# Patient Record
Sex: Female | Born: 1990
Health system: Southern US, Community
[De-identification: ages and names within clinical notes are randomized; demographics above are authoritative.]

## PROBLEM LIST (undated history)

## (undated) HISTORY — PX: WISDOM TOOTH EXTRACTION: SHX21

---

## 2017-02-07 ENCOUNTER — Emergency Department (HOSPITAL_COMMUNITY)
Admission: EM | Admit: 2017-02-07 | Discharge: 2017-02-07 | Disposition: A | Discharge status: Home / Self Care | Payer: PRIVATE HEALTH INSURANCE | Attending: Emergency Medicine | Admitting: Emergency Medicine

## 2017-02-07 ENCOUNTER — Emergency Department (HOSPITAL_COMMUNITY): Payer: PRIVATE HEALTH INSURANCE

## 2017-02-07 ENCOUNTER — Encounter (HOSPITAL_COMMUNITY): Payer: Self-pay | Admitting: Emergency Medicine

## 2017-02-07 DIAGNOSIS — Y939 Activity, unspecified: Secondary | ICD-10-CM | POA: Diagnosis not present

## 2017-02-07 DIAGNOSIS — Y999 Unspecified external cause status: Secondary | ICD-10-CM | POA: Insufficient documentation

## 2017-02-07 DIAGNOSIS — Y929 Unspecified place or not applicable: Secondary | ICD-10-CM | POA: Insufficient documentation

## 2017-02-07 DIAGNOSIS — S0083XA Contusion of other part of head, initial encounter: Secondary | ICD-10-CM | POA: Insufficient documentation

## 2017-02-07 DIAGNOSIS — S0990XA Unspecified injury of head, initial encounter: Secondary | ICD-10-CM | POA: Diagnosis present

## 2017-02-07 LAB — POC URINE PREG, ED: Preg Test, Ur: NEGATIVE

## 2017-02-07 NOTE — ED Notes (Signed)
Pt A&Ox4, ambulatory at d/c with independent steady gait, NAD. Pt verbalized understanding of d/c instructions and follow up care. 

## 2017-02-07 NOTE — ED Notes (Signed)
Nurse will talk to provider about if urine still needed.
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
## 2017-02-07 NOTE — Discharge Instructions (Signed)
Apply ice several times a day. Take acetaminophen, ibuprofen, or naproxen as needed for pain. °
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
## 2017-08-23 ENCOUNTER — Other Ambulatory Visit: Payer: Self-pay

## 2017-08-23 ENCOUNTER — Encounter (HOSPITAL_COMMUNITY): Payer: Self-pay | Admitting: Emergency Medicine

## 2017-08-23 ENCOUNTER — Emergency Department (HOSPITAL_COMMUNITY)
Admission: EM | Admit: 2017-08-23 | Discharge: 2017-08-23 | Disposition: A | Discharge status: Home / Self Care | Payer: No Typology Code available for payment source | Attending: Emergency Medicine | Admitting: Emergency Medicine

## 2017-08-23 DIAGNOSIS — M542 Cervicalgia: Secondary | ICD-10-CM | POA: Insufficient documentation

## 2017-08-23 DIAGNOSIS — M7918 Myalgia, other site: Secondary | ICD-10-CM

## 2017-08-23 DIAGNOSIS — Z041 Encounter for examination and observation following transport accident: Secondary | ICD-10-CM | POA: Diagnosis present

## 2017-08-23 MED ORDER — METHOCARBAMOL 500 MG PO TABS: 500.0000 mg | ORAL_TABLET | Freq: Every evening | ORAL | 0 refills | Status: DC | PRN
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
## 2018-07-31 ENCOUNTER — Ambulatory Visit (INDEPENDENT_AMBULATORY_CARE_PROVIDER_SITE_OTHER): Payer: 59 | Admitting: Family Medicine

## 2018-07-31 ENCOUNTER — Encounter: Payer: Self-pay | Admitting: Family Medicine

## 2018-07-31 VITALS — BP 98/70 | HR 58 | Temp 97.5°F | Ht 63.0 in | Wt 160.0 lb

## 2018-07-31 DIAGNOSIS — Z13220 Encounter for screening for lipoid disorders: Secondary | ICD-10-CM | POA: Diagnosis not present

## 2018-07-31 DIAGNOSIS — R5383 Other fatigue: Secondary | ICD-10-CM

## 2018-07-31 DIAGNOSIS — Z131 Encounter for screening for diabetes mellitus: Secondary | ICD-10-CM

## 2018-07-31 DIAGNOSIS — Z0001 Encounter for general adult medical examination with abnormal findings: Secondary | ICD-10-CM

## 2018-07-31 DIAGNOSIS — Z0000 Encounter for general adult medical examination without abnormal findings: Secondary | ICD-10-CM

## 2018-07-31 LAB — BASIC METABOLIC PANEL
BUN: 11 mg/dL (ref 6–23)
CALCIUM: 9.1 mg/dL (ref 8.4–10.5)
CO2: 27 meq/L (ref 19–32)
CREATININE: 0.9 mg/dL (ref 0.40–1.20)
Chloride: 103 mEq/L (ref 96–112)
GFR: 92.1 mL/min (ref >60.00)
Glucose, Bld: 83 mg/dL (ref 70–99)
Potassium: 4.2 mEq/L (ref 3.5–5.1)
Sodium: 139 mEq/L (ref 135–145)

## 2018-07-31 LAB — CBC WITH DIFFERENTIAL/PLATELET
BASOS ABS: 0 10*3/uL (ref 0.0–0.1)
BASOS PCT: 0.4 % (ref 0.0–3.0)
Eosinophils Absolute: 0.1 10*3/uL (ref 0.0–0.7)
Eosinophils Relative: 1.8 % (ref 0.0–5.0)
HEMATOCRIT: 38.7 % (ref 36.0–46.0)
Hemoglobin: 12.3 g/dL (ref 12.0–15.0)
LYMPHS PCT: 39.7 % (ref 12.0–46.0)
Lymphs Abs: 2.5 10*3/uL (ref 0.7–4.0)
MCHC: 31.8 g/dL (ref 30.0–36.0)
MCV: 74.2 fl — ABNORMAL LOW (ref 78.0–100.0)
MONO ABS: 0.5 10*3/uL (ref 0.1–1.0)
Monocytes Relative: 7.7 % (ref 3.0–12.0)
NEUTROS ABS: 3.2 10*3/uL (ref 1.4–7.7)
NEUTROS PCT: 50.4 % (ref 43.0–77.0)
PLATELETS: 285 10*3/uL (ref 150.0–400.0)
RBC: 5.21 Mil/uL — ABNORMAL HIGH (ref 3.87–5.11)
RDW: 13.9 % (ref 11.5–15.5)
WBC: 6.3 10*3/uL (ref 4.0–10.5)

## 2018-07-31 LAB — LIPID PANEL
CHOL/HDL RATIO: 3
Cholesterol: 228 mg/dL — ABNORMAL HIGH (ref 0–200)
HDL: 81.9 mg/dL (ref >39.00)
LDL CALC: 134 mg/dL — AB (ref 0–99)
NONHDL: 146.2
Triglycerides: 61 mg/dL (ref 0.0–149.0)
VLDL: 12.2 mg/dL (ref 0.0–40.0)

## 2018-07-31 LAB — TSH: TSH: 2.68 u[IU]/mL (ref 0.35–4.50)

## 2018-07-31 LAB — T4, FREE: Free T4: 0.81 ng/dL (ref 0.60–1.60)

## 2018-07-31 LAB — HEMOGLOBIN A1C: Hgb A1c MFr Bld: 5.6 % (ref 4.6–6.5)

## 2018-07-31 LAB — VITAMIN D 25 HYDROXY (VIT D DEFICIENCY, FRACTURES): VITD: 21.17 ng/mL — ABNORMAL LOW (ref 30.00–100.00)

## 2018-07-31 NOTE — Progress Notes (Signed)
Subjective:     Brenda HeysRebecca Levitt is a 28 y.o. female and is here for a comprehensive physical exam. The patient reports problems - fatigue.  Pt would like labs, is fasting.  Pt notes waking up feeling tired some nights.  Pt notes feeling more if she goes to bed at midnight.  Typically wakes up at 730 to 8 AM.  Patient trying to drink more water, may have 3 containers at work and a few glasses at home.  Patient denies prior history of anemia.  Last Pap: 2019 LMP 08/02/2018.  On the patch x2 years. Allergies: NKDA  Social history: Patient is single.  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  Pt endorses social alcohol use.  Pt denies tobacco and drug use.  Family medical history: Mom-depression, mental illness Dad-diabetes Sister-Marcelle Sister-[place] Brother-Orphe Social History   Socioeconomic History  . Marital status: Single    Spouse name: Not on file  . Number of children: Not on file  . Years of education: Not on file  . Highest education level: Not on file  Occupational History  . Not on file  Social Needs  . Financial resource strain: Not on file  . Food insecurity:    Worry: Not on file    Inability: Not on file  . Transportation needs:    Medical: Not on file    Non-medical: Not on file  Tobacco Use  . Smoking status: Never Smoker  . Smokeless tobacco: Never Used  Substance and Sexual Activity  . Alcohol use: Yes    Comment: occ  . Drug use: No  . Sexual activity: Yes    Birth control/protection: Patch  Lifestyle  . Physical activity:    Days per week: Not on file    Minutes per session: Not on file  . Stress: Not on file  Relationships  . Social connections:    Talks on phone: Not on file    Gets together: Not on file    Attends religious service: Not on file    Active member of club or organization: Not on file    Attends meetings of clubs or organizations: Not on file    Relationship status: Not on file  . Intimate  partner violence:    Fear of current or ex partner: Not on file    Emotionally abused: Not on file    Physically abused: Not on file    Forced sexual activity: Not on file  Other Topics Concern  . Not on file  Social History Narrative  . Not on file   Health Maintenance  Topic Date Due  . HIV Screening  04/18/2008  . TETANUS/TDAP  04/18/2012  . PAP-Cervical Cytology Screening  04/18/2014  . PAP SMEAR-Modifier  04/18/2014  . INFLUENZA VACCINE  02/09/2018    The following portions of the patient's history were reviewed and updated as appropriate: allergies, current medications, past family history, past medical history, past social history, past surgical history and problem list.  Review of Systems Pertinent items noted in HPI and remainder of comprehensive ROS otherwise negative.   Objective:    BP 98/70 (BP Location: Left Arm, Patient Position: Sitting, Cuff Size: Normal)   Pulse (!) 58   Temp (!) 97.5 F (36.4 C) (Oral)   Ht 5\' 3"  (1.6 m)   Wt 160 lb (72.6 kg)   LMP 07/26/2018 (Exact Date)   SpO2 99%   BMI 28.34 kg/m  General appearance:  alert, cooperative and no distress Head: Normocephalic, without obvious abnormality, atraumatic Eyes: conjunctivae/corneas clear. PERRL, EOM's intact. Fundi benign. Ears: normal TM's and external ear canals both ears Nose: Nares normal. Septum midline. Mucosa normal. No drainage or sinus tenderness. Throat: lips, mucosa, and tongue normal; teeth and gums normal Neck: no adenopathy, no carotid bruit, no JVD, supple, symmetrical, trachea midline and thyroid not enlarged, symmetric, no tenderness/mass/nodules Lungs: clear to auscultation bilaterally Heart: regular rate and rhythm, S1, S2 normal, no murmur, click, rub or gallop Abdomen: soft, non-tender; bowel sounds normal; no masses,  no organomegaly Extremities: extremities normal, atraumatic, no cyanosis or edema Skin: Skin color, texture, turgor normal. No rashes or  lesions Neurologic: Alert and oriented X 3, normal strength and tone. Normal symmetric reflexes. Normal coordination and gait    Assessment:    Healthy female exam.     Plan:     Anticipatory guidance given including wearing seatbelts, smoke detectors in the home, increasing physical activity, increasing p.o. intake of water and vegetables. -will obtain labs -given handout -Next CPE in 1 year See After Visit Summary for Counseling Recommendations    Fatigue -We will obtain labs including vitamin D, TSH, free T4, CBC -Discussed ways to decrease fatigue including getting plenty of rest each night, exercising -Given handout.  Abbe Amsterdam, MD

## 2018-07-31 NOTE — Patient Instructions (Signed)
Preventive Care 18-39 Years, Female Preventive care refers to lifestyle choices and visits with your health care provider that can promote health and wellness. What does preventive care include?   A yearly physical exam. This is also called an annual well check.  Dental exams once or twice a year.  Routine eye exams. Ask your health care provider how often you should have your eyes checked.  Personal lifestyle choices, including: ? Daily care of your teeth and gums. ? Regular physical activity. ? Eating a healthy diet. ? Avoiding tobacco and drug use. ? Limiting alcohol use. ? Practicing safe sex. ? Taking vitamin and mineral supplements as recommended by your health care provider. What happens during an annual well check? The services and screenings done by your health care provider during your annual well check will depend on your age, overall health, lifestyle risk factors, and family history of disease. Counseling Your health care provider may ask you questions about your:  Alcohol use.  Tobacco use.  Drug use.  Emotional well-being.  Home and relationship well-being.  Sexual activity.  Eating habits.  Work and work environment.  Method of birth control.  Menstrual cycle.  Pregnancy history. Screening You may have the following tests or measurements:  Height, weight, and BMI.  Diabetes screening. This is done by checking your blood sugar (glucose) after you have not eaten for a while (fasting).  Blood pressure.  Lipid and cholesterol levels. These may be checked every 5 years starting at age 20.  Skin check.  Hepatitis C blood test.  Hepatitis B blood test.  Sexually transmitted disease (STD) testing.  BRCA-related cancer screening. This may be done if you have a family history of breast, ovarian, tubal, or peritoneal cancers.  Pelvic exam and Pap test. This may be done every 3 years starting at age 21. Starting at age 30, this may be done every 5  years if you have a Pap test in combination with an HPV test. Discuss your test results, treatment options, and if necessary, the need for more tests with your health care provider. Vaccines Your health care provider may recommend certain vaccines, such as:  Influenza vaccine. This is recommended every year.  Tetanus, diphtheria, and acellular pertussis (Tdap, Td) vaccine. You may need a Td booster every 10 years.  Varicella vaccine. You may need this if you have not been vaccinated.  HPV vaccine. If you are 26 or younger, you may need three doses over 6 months.  Measles, mumps, and rubella (MMR) vaccine. You may need at least one dose of MMR. You may also need a second dose.  Pneumococcal 13-valent conjugate (PCV13) vaccine. You may need this if you have certain conditions and were not previously vaccinated.  Pneumococcal polysaccharide (PPSV23) vaccine. You may need one or two doses if you smoke cigarettes or if you have certain conditions.  Meningococcal vaccine. One dose is recommended if you are age 19-21 years and a first-year college student living in a residence hall, or if you have one of several medical conditions. You may also need additional booster doses.  Hepatitis A vaccine. You may need this if you have certain conditions or if you travel or work in places where you may be exposed to hepatitis A.  Hepatitis B vaccine. You may need this if you have certain conditions or if you travel or work in places where you may be exposed to hepatitis B.  Haemophilus influenzae type b (Hib) vaccine. You may need this if you   have certain risk factors. Talk to your health care provider about which screenings and vaccines you need and how often you need them. This information is not intended to replace advice given to you by your health care provider. Make sure you discuss any questions you have with your health care provider. Document Released: 08/24/2001 Document Revised: 02/08/2017  Document Reviewed: 04/29/2015 Elsevier Interactive Patient Education  2019 [employer].  Fatigue If you have fatigue, you feel tired all the time and have a lack of energy or a lack of motivation. Fatigue may make it difficult to start or complete tasks because of exhaustion. In general, occasional or mild fatigue is often a normal response to activity or life. However, long-lasting (chronic) or extreme fatigue may be a symptom of a medical condition. Follow these instructions at home: General instructions  Watch your fatigue for any changes.  Go to bed and get up at the same time every day.  Avoid fatigue by pacing yourself during the day and getting enough sleep at night.  Maintain a healthy weight. Medicines  Take over-the-counter and prescription medicines only as told by your health care provider.  Take a multivitamin, if told by your health care provider.  Do not use herbal or dietary supplements unless they are approved by your health care provider. Activity   Exercise regularly, as told by your health care provider.  Use or practice techniques to help you relax, such as yoga, tai chi, meditation, or massage therapy. Eating and drinking   Avoid heavy meals in the evening.  Eat a well-balanced diet, which includes lean proteins, whole grains, plenty of fruits and vegetables, and low-fat dairy products.  Avoid consuming too much caffeine.  Avoid the use of alcohol.  Drink enough fluid to keep your urine pale yellow. Lifestyle  Change situations that cause you stress. Try to keep your work and personal schedule in balance.  Do not use any products that contain nicotine or tobacco, such as cigarettes and e-cigarettes. If you need help quitting, ask your health care provider.  Do not use drugs. Contact a health care provider if:  Your fatigue does not get better.  You have a fever.  You suddenly lose or gain weight.  You have headaches.  You have trouble  falling asleep or sleeping through the night.  You feel angry, guilty, anxious, or sad.  You are unable to have a bowel movement (constipation).  Your skin is dry.  You have swelling in your legs or another part of your body. Get help right away if:  You feel confused.  Your vision is blurry.  You feel faint or you pass out.  You have a severe headache.  You have severe pain in your abdomen, your back, or the area between your waist and hips (pelvis).  You have chest pain, shortness of breath, or an irregular or fast heartbeat.  You are unable to urinate, or you urinate less than normal.  You have abnormal bleeding, such as bleeding from the rectum, vagina, nose, lungs, or nipples.  You vomit blood.  You have thoughts about hurting yourself or others. If you ever feel like you may hurt yourself or others, or have thoughts about taking your own life, get help right away. You can go to your nearest emergency department or call:  Your local emergency services (911 in the [place]  A suicide crisis helpline, such as the [facility] at 601-715-9992. This is open 24 hours a day. Summary  If you have fatigue, you feel tired all the time and have a lack of energy or a lack of motivation.  Fatigue may make it difficult to start or complete tasks because of exhaustion.  Long-lasting (chronic) or extreme fatigue may be a symptom of a medical condition.  Exercise regularly, as told by your health care provider.  Change situations that cause you stress. Try to keep your work and personal schedule in balance. This information is not intended to replace advice given to you by your health care provider. Make sure you discuss any questions you have with your health care provider. Document Released: 04/25/2007 Document Revised: 03/23/2017 Document Reviewed: 03/23/2017 Elsevier Interactive Patient Education  [employer].

## 2018-08-01 ENCOUNTER — Other Ambulatory Visit: Payer: Self-pay | Admitting: Family Medicine

## 2018-08-01 DIAGNOSIS — E559 Vitamin D deficiency, unspecified: Secondary | ICD-10-CM

## 2018-08-01 DIAGNOSIS — L910 Hypertrophic scar: Secondary | ICD-10-CM | POA: Diagnosis not present

## 2018-08-01 MED ORDER — VITAMIN D (ERGOCALCIFEROL) 1.25 MG (50000 UNIT) PO CAPS
50000.0000 [IU] | ORAL_CAPSULE | ORAL | 0 refills | Status: AC
Start: 2018-08-01 — End: ?

## 2018-08-04 ENCOUNTER — Telehealth: Payer: Self-pay

## 2018-08-04 NOTE — Telephone Encounter (Signed)
Spoke with pt aware of her lab results
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
## 2018-10-22 ENCOUNTER — Other Ambulatory Visit: Payer: Self-pay | Admitting: Family Medicine

## 2018-10-22 DIAGNOSIS — E559 Vitamin D deficiency, unspecified: Secondary | ICD-10-CM

## 2018-10-25 ENCOUNTER — Other Ambulatory Visit: Payer: Self-pay | Admitting: Family Medicine

## 2018-10-25 DIAGNOSIS — E559 Vitamin D deficiency, unspecified: Secondary | ICD-10-CM

## 2018-10-26 ENCOUNTER — Other Ambulatory Visit: Payer: Self-pay | Admitting: Family Medicine

## 2018-10-26 ENCOUNTER — Other Ambulatory Visit: Payer: Self-pay

## 2018-10-26 ENCOUNTER — Encounter: Payer: Self-pay | Admitting: Family Medicine

## 2018-10-26 MED ORDER — NORELGESTROMIN-ETH ESTRADIOL 150-35 MCG/24HR TD PTWK: 1.0000 | MEDICATED_PATCH | TRANSDERMAL | 2 refills | Status: AC

## 2018-10-26 NOTE — Telephone Encounter (Signed)
Rx has already been sent to pt pharmacy °

## 2018-10-26 NOTE — Telephone Encounter (Signed)
This Rx was a one time dose, pt is aware to get OTC vit D.

## 2018-10-26 NOTE — Telephone Encounter (Signed)
Requested medication (s) are due for refill today: ?  Requested medication (s) are on the active medication list: yes  Last refill:  ?  Future visit scheduled: no  Notes to clinic: historical provider   Requested Prescriptions  Pending Prescriptions Disp Refills   norelgestromin-ethinyl estradiol (ORTHO EVRA) 150-35 MCG/24HR transdermal patch 3 patch     Sig: Place 1 patch onto the skin once a week.     OB/GYN:  Contraceptives Passed - 10/26/2018  9:24 AM      Passed - Last BP in normal range    BP Readings from Last 1 Encounters:  07/31/18 98/70         Passed - Valid encounter within last 12 months    Recent Outpatient Visits          2 months ago Well adult exam   Nature conservation officer at [employer], Bettey Mare, MD

## 2019-01-24 ENCOUNTER — Other Ambulatory Visit: Payer: Self-pay | Admitting: *Deleted

## 2019-01-24 DIAGNOSIS — Z20822 Contact with and (suspected) exposure to covid-19: Secondary | ICD-10-CM

## 2019-01-28 LAB — NOVEL CORONAVIRUS, NAA: SARS-CoV-2, NAA: NOT DETECTED

## 2020-12-30 ENCOUNTER — Other Ambulatory Visit: Payer: Self-pay

## 2020-12-31 ENCOUNTER — Ambulatory Visit (INDEPENDENT_AMBULATORY_CARE_PROVIDER_SITE_OTHER): Payer: 59 | Admitting: Family Medicine

## 2020-12-31 ENCOUNTER — Encounter: Payer: Self-pay | Admitting: Family Medicine

## 2020-12-31 VITALS — BP 112/72 | HR 66 | Temp 98.2°F | Wt 156.6 lb

## 2020-12-31 DIAGNOSIS — M25561 Pain in right knee: Secondary | ICD-10-CM

## 2020-12-31 DIAGNOSIS — M25572 Pain in left ankle and joints of left foot: Secondary | ICD-10-CM | POA: Diagnosis not present

## 2020-12-31 DIAGNOSIS — W109XXD Fall (on) (from) unspecified stairs and steps, subsequent encounter: Secondary | ICD-10-CM | POA: Diagnosis not present

## 2020-12-31 NOTE — Progress Notes (Signed)
Subjective:    Patient ID: Brenda Leblanc, female    DOB: 1991/02/03, 30 y.o.   MRN: [id number]  Chief Complaint  Patient presents with   Knee Injury    Larey Seat and banged up rt knee, and left ankle has some swelling    HPI Patient was seen today for follow-up on acute concern.  Patient endorses falling down 7-8 steps on Friday 6/17 while out of town in [place].  Pt landed on her knees.  Was able to get up and bear weight immediately after injury.  Went to [place] ED for eval.  Per pt's handout/AVS: xray R knee without acute fx or dislocation, xray of L ankle without acute injury.  Pt notes soreness in R knee and L ankle.  Taking ibuprofen at night as does not like to take much medicine, and using ice.  Notes increased pain with stepping up on surfaces.  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  Works from home during the week.  No past medical history on file.  No Known Allergies  ROS General: Denies fever, chills, night sweats, changes in weight, changes in appetite HEENT: Denies headaches, ear pain, changes in vision, rhinorrhea, sore throat CV: Denies CP, palpitations, SOB, orthopnea Pulm: Denies SOB, cough, wheezing GI: Denies abdominal pain, nausea, vomiting, diarrhea, constipation GU: Denies dysuria, hematuria, frequency, vaginal discharge Msk: Denies muscle cramps, joint pains  R knee pain, L ankle pain Neuro: Denies weakness, numbness, tingling Skin: Denies rashes, +bruising on R knee and L ankle Psych: Denies depression, anxiety, hallucinations     Objective:    Blood pressure 112/72, pulse 66, temperature 98.2 F (36.8 C), temperature source Oral, weight 156 lb 9.6 oz (71 kg), SpO2 99 %.  Gen. Pleasant, well-nourished, in no distress, normal affect   HEENT: [place]/AT, face symmetric, conjunctiva clear, no scleral icterus, PERRLA, EOMI, nares patent without drainage Lungs: no accessory muscle use Cardiovascular: RRR, no  peripheral edema Musculoskeletal: Ecchymosis of the lateral left ankle.  FROM of left ankle.  Right knee with mild edema and ecchymosis medially.   Mild TTP at area of ecchymosis.  No other joint line TTP.  Normal ROM of right knee without crepitus.  No deformities, no cyanosis or clubbing, normal tone Neuro:  A&Ox3, CN II-XII intact, normal gait Skin:  Warm, no lesions/ rash.  Ecchymosis of R medial knee and L lateral ankle.   Wt Readings from Last 3 Encounters:  12/31/20 156 lb 9.6 oz (71 kg)  07/31/18 160 lb (72.6 kg)  08/23/17 130 lb (59 kg)    Lab Results  Component Value Date   WBC 6.3 07/31/2018   HGB 12.3 07/31/2018   HCT 38.7 07/31/2018   PLT 285.0 07/31/2018   GLUCOSE 83 07/31/2018   CHOL 228 (H) 07/31/2018   TRIG 61.0 07/31/2018   HDL 81.90 07/31/2018   LDLCALC 134 (H) 07/31/2018   NA 139 07/31/2018   K 4.2 07/31/2018   CL 103 07/31/2018   CREATININE 0.90 07/31/2018   BUN 11 07/31/2018   CO2 27 07/31/2018   TSH 2.68 07/31/2018   HGBA1C 5.6 07/31/2018    Assessment/Plan:  Fall on or from stairs or steps, subsequent encounter -Given note for work.  Advised to remain out a part-time job this weekend.  Acute pain of right knee -2/2 sprain from fall -X-ray results were reviewed from [facility] ED.  No acute fracture or dislocation -Continue supportive care including rest,  elevation, ice, heat, NSAIDs, compression. -Advised to schedule ibuprofen over the next few days, then use as needed.  Advised to take NSAIDs with food. -Right knee wrapped in clinic with Ace bandage by this provider -Range of motion exercises encouraged.  Increase activity as tolerated. -Advised likely to remain sore times weeks as ecchymosis resolves.  Acute left ankle pain -X-ray results reviewed and under the weekend at [facility] ED.  X-ray without acute injury -Continue supportive care including rest, elevation, ice, heat, NSAIDs, compression -Ankle exercises advised -Discussed likely  to remain sore for several weeks  F/u prn  Abbe Amsterdam, MD

## 2023-07-21 ENCOUNTER — Telehealth: Payer: Self-pay | Admitting: Family Medicine

## 2023-07-21 NOTE — Telephone Encounter (Signed)
 Last Visit:  12/31/2020 Called Pt to schedule a CPE.  LVM for Pt to call us back to schedule.
